# Patient Record
Sex: Male | Born: 1975 | Race: White | Hispanic: No | Marital: Married | Smoking: Never smoker
Health system: Southern US, Community
[De-identification: ages and names within clinical notes are randomized; demographics above are authoritative.]

---

## 2003-07-21 HISTORY — PX: EXTERNAL EAR SURGERY: SHX627

## 2006-08-03 ENCOUNTER — Emergency Department (HOSPITAL_COMMUNITY): Admission: EM | Admit: 2006-08-03 | Discharge: 2006-08-03 | Payer: Self-pay | Admitting: Emergency Medicine

## 2006-09-06 ENCOUNTER — Ambulatory Visit: Payer: Self-pay | Admitting: Family Medicine

## 2006-09-14 ENCOUNTER — Ambulatory Visit: Payer: Self-pay | Admitting: Family Medicine

## 2006-10-26 ENCOUNTER — Emergency Department (HOSPITAL_COMMUNITY): Admission: EM | Admit: 2006-10-26 | Discharge: 2006-10-26 | Payer: Self-pay | Admitting: Emergency Medicine

## 2007-11-17 ENCOUNTER — Emergency Department (HOSPITAL_COMMUNITY): Admission: EM | Admit: 2007-11-17 | Discharge: 2007-11-17 | Payer: Self-pay | Admitting: Emergency Medicine

## 2013-05-16 ENCOUNTER — Encounter (HOSPITAL_COMMUNITY): Payer: Self-pay | Admitting: Emergency Medicine

## 2013-05-16 ENCOUNTER — Emergency Department (HOSPITAL_COMMUNITY): Payer: Self-pay

## 2013-05-16 ENCOUNTER — Emergency Department (HOSPITAL_COMMUNITY)
Admission: EM | Admit: 2013-05-16 | Discharge: 2013-05-17 | Disposition: A | Discharge status: Home / Self Care | Payer: Self-pay | Attending: Emergency Medicine | Admitting: Emergency Medicine

## 2013-05-16 DIAGNOSIS — R109 Unspecified abdominal pain: Secondary | ICD-10-CM

## 2013-05-16 DIAGNOSIS — K2970 Gastritis, unspecified, without bleeding: Secondary | ICD-10-CM

## 2013-05-16 LAB — COMPREHENSIVE METABOLIC PANEL
ALT: 16 U/L (ref 0–53)
CO2: 25 mEq/L (ref 19–32)
Creatinine, Ser: 0.98 mg/dL (ref 0.50–1.35)
GFR calc Af Amer: >90 mL/min (ref >90)
GFR calc non Af Amer: >90 mL/min (ref >90)
Sodium: 136 mEq/L (ref 135–145)

## 2013-05-16 LAB — LIPASE, BLOOD: Lipase: 20 U/L (ref 11–59)

## 2013-05-16 MED ORDER — ONDANSETRON HCL 4 MG/2ML IJ SOLN
4.0000 mg | Freq: Once | INTRAMUSCULAR | Status: AC
  Administered 2013-05-16: 4 mg via INTRAVENOUS
  Filled 2013-05-16: qty 2

## 2013-05-16 NOTE — ED Provider Notes (Signed)
CSN: [id number]     Arrival date & time 05/16/13  2212 History   First MD Initiated Contact with Patient 05/16/13 2258     Chief Complaint  Patient presents with  . Abdominal Pain   (Consider location/radiation/quality/duration/timing/severity/associated sxs/prior Treatment) HPI Comments: Patient presents to the ER for evaluation of abdominal pain. Pain is present central upper abdomen for one week. He reports that the pain has been coming and going. He has taken Tylenol and the small without relief. He did experience his first nausea and vomiting tonight. Has not had any fever or diarrhea. Patient reports that he had similar symptoms years ago that was attributed to drinking alcohol. He has been sober for several years, in the last 2 months, however, has begun drinking to 6 beers a night after work. He has not had any hematemesis. No melena. Patient reports that since she has come back to the room in the emergency department, his pain has resolved.  Patient is a 37 y.o. male presenting with abdominal pain.  Abdominal Pain Associated symptoms: nausea and vomiting   Associated symptoms: no diarrhea and no fever     History reviewed. No pertinent past medical history. Past Surgical History  Procedure Laterality Date  . External ear surgery Left 2005   History reviewed. No pertinent family history. History  Substance Use Topics  . Smoking status: Never Smoker   . Smokeless tobacco: Never Used  . Alcohol Use: 1.8 oz/week    3 Cans of beer per week     Comment: occ.    Review of Systems  Constitutional: Negative for fever.  Gastrointestinal: Positive for nausea, vomiting and abdominal pain. Negative for diarrhea and blood in stool.  All other systems reviewed and are negative.    Allergies  Review of patient's allergies indicates no known allergies.  Home Medications   Current Outpatient Rx  Name  Route  Sig  Dispense  Refill  . acetaminophen (TYLENOL) 500 MG tablet   Oral   Take 1,000 mg by mouth every 6 (six) hours as needed for pain.         Marland Kitchen bismuth subsalicylate (PEPTO BISMOL) 262 MG/15ML suspension   Oral   Take 15 mLs by mouth every 6 (six) hours as needed for indigestion.          BP 129/71  Pulse 69  Temp(Src) 98.9 F (37.2 C) (Oral)  Resp 18  Ht 5\' 6"  (1.676 m)  Wt 170 lb (77.111 kg)  BMI 27.45 kg/m2  SpO2 100% Physical Exam  Constitutional: He is oriented to person, place, and time. He appears well-developed and well-nourished. No distress.  HENT:  Head: Normocephalic and atraumatic.  Right Ear: Hearing normal.  Left Ear: Hearing normal.  Nose: Nose normal.  Mouth/Throat: Oropharynx is clear and moist and mucous membranes are normal.  Eyes: Conjunctivae and EOM are normal. Pupils are equal, round, and reactive to light.  Neck: Normal range of motion. Neck supple.  Cardiovascular: Regular rhythm, S1 normal and S2 normal.  Exam reveals no gallop and no friction rub.   No murmur heard. Pulmonary/Chest: Effort normal and breath sounds normal. No respiratory distress. He exhibits no tenderness.  Abdominal: Soft. Normal appearance and bowel sounds are normal. There is no hepatosplenomegaly. There is no tenderness. There is no rebound, no guarding, no tenderness at McBurney's point and negative Murphy's sign. No hernia.  Musculoskeletal: Normal range of motion.  Neurological: He is alert and oriented to person, place, and time. He has  normal strength. No cranial nerve deficit or sensory deficit. Coordination normal. GCS eye subscore is 4. GCS verbal subscore is 5. GCS motor subscore is 6.  Skin: Skin is warm, dry and intact. No rash noted. No cyanosis.  Psychiatric: He has a normal mood and affect. His speech is normal and behavior is normal. Thought content normal.    ED Course  Procedures (including critical care time) Labs Review Labs Reviewed  CBC WITH DIFFERENTIAL - Abnormal; Notable for the following:    Eosinophils Relative 8 (*)     All other components within normal limits  COMPREHENSIVE METABOLIC PANEL - Abnormal; Notable for the following:    Glucose, Bld 123 (*)    All other components within normal limits  LIPASE, BLOOD  URINALYSIS, ROUTINE W REFLEX MICROSCOPIC   Imaging Review Dg Abd Acute W/chest  05/16/2013   CLINICAL DATA:  Abdominal pain.  EXAM: ACUTE ABDOMEN SERIES (ABDOMEN 2 VIEW & CHEST 1 VIEW)  COMPARISON:  08/03/2006 chest x-ray.  FINDINGS: Abnormal bowel gas pattern with multiple air-fluid levels, both in small and large bowel. There appears to be fold thickening of small bowel loops in the central abdomen, which are up to 3 cm in diameter. No pneumoperitoneum. No abnormal intra-abdominal mass effect or calcification. Clear lungs. No effusion or pneumothorax. Normal heart size. No acute osseous findings.  IMPRESSION: Abnormal bowel gas pattern. Multiple fluid levels in colon and small bowel favors ileus, but an early small bowel obstruction would have a similar appearance.   Electronically Signed   By: Tiburcio Pea M.D.   On: 05/16/2013 23:38    EKG Interpretation   None       MDM  Diagnosis: Abdominal pain  Post presents to the ER for evaluation of abdominal pain for one week. Patient has been having intermittent pain in his upper abdomen. Upon evaluation here in the ER, however, his pain has resolved. Patient does report that he recently started drinking again and has had similar pain secondary to alcohol intake in the past. This is most likely consistent with gastritis. Patient's abdominal exam is benign and nontender. His vital signs are normal. Blood work is all normal including liver function tests and lipase. The concern for pancreatitis or gallbladder disease. Patient does not have lower abdominal pain or tenderness, no concern for appendicitis. X-ray does show an abnormal bowel gas pattern. Ileus favored, cannot rule out small bowel obstruction. Based on his current examination, however,  small bowel obstruction is not felt to be likely. Patient will be discharged from a clear liquid diet, advance as tolerated. Patient given instructions to return to the ER for increasing pain, nausea, vomiting or abdominal distention. Recommended that he stop drinking. We'll treat with Carafate and Zantac.    Gilda Crease, MD 05/17/13 [phone]

## 2013-05-16 NOTE — ED Notes (Signed)
Pt reports abdominal pain x 1 week, has been taking Tylenol and Pepto bismol at home without relief. Pt reports n/v tonight, denies diarrhea, LBM tonight.

## 2013-05-16 NOTE — ED Notes (Signed)
Patient ambulated to and from radiology dept without issue Patient in NAD

## 2013-05-16 NOTE — ED Notes (Signed)
Patient with c/o abdominal pain, N/V x 9 days No active N/V during assessment PIV placed, labs drawn and sent Patient medicated, see MAR Side rails up, call bell in reach

## 2013-05-17 LAB — URINALYSIS, ROUTINE W REFLEX MICROSCOPIC
Bilirubin Urine: NEGATIVE
Nitrite: NEGATIVE
Urobilinogen, UA: 0.2 mg/dL (ref 0.0–1.0)

## 2013-05-17 LAB — CBC WITH DIFFERENTIAL/PLATELET
Basophils Absolute: 0 10*3/uL (ref 0.0–0.1)
Basophils Relative: 0 % (ref 0–1)
Eosinophils Relative: 8 % — ABNORMAL HIGH (ref 0–5)
HCT: 48.1 % (ref 39.0–52.0)
Lymphocytes Relative: 14 % (ref 12–46)
Lymphs Abs: 1.1 10*3/uL (ref 0.7–4.0)
MCV: 87.3 fL (ref 78.0–100.0)
Monocytes Absolute: 0.3 10*3/uL (ref 0.1–1.0)
Monocytes Relative: 4 % (ref 3–12)
Neutro Abs: 5.8 10*3/uL (ref 1.7–7.7)
Neutrophils Relative %: 75 % (ref 43–77)

## 2013-05-17 MED ORDER — RANITIDINE HCL 150 MG PO TABS: 150.0000 mg | ORAL_TABLET | Freq: Two times a day (BID) | ORAL | Status: DC

## 2013-05-17 MED ORDER — SUCRALFATE 1 GM/10ML PO SUSP: 1.0000 g | Freq: Four times a day (QID) | ORAL | Status: DC

## 2013-05-17 NOTE — ED Notes (Signed)
Discharge instructions reviewed with patient and family Rx x 2 reviewed with patient and family Follow up instructions reviewed with patient and family Patient and family verbalized understanding to DC instructions, Rx and follow up care All questions answered  Patient and family deny further needs or concerns at this time Patient alert and oriented x 4 upon time of DC and in NAD

## 2015-05-14 ENCOUNTER — Emergency Department (HOSPITAL_COMMUNITY): Payer: Self-pay

## 2015-05-14 ENCOUNTER — Encounter (HOSPITAL_COMMUNITY): Payer: Self-pay | Admitting: Emergency Medicine

## 2015-05-14 ENCOUNTER — Emergency Department (HOSPITAL_COMMUNITY)
Admission: EM | Admit: 2015-05-14 | Discharge: 2015-05-14 | Disposition: A | Discharge status: Home / Self Care | Payer: Self-pay | Attending: Emergency Medicine | Admitting: Emergency Medicine

## 2015-05-14 DIAGNOSIS — S43005A Unspecified dislocation of left shoulder joint, initial encounter: Secondary | ICD-10-CM

## 2015-05-14 DIAGNOSIS — Y9389 Activity, other specified: Secondary | ICD-10-CM | POA: Insufficient documentation

## 2015-05-14 DIAGNOSIS — X58XXXA Exposure to other specified factors, initial encounter: Secondary | ICD-10-CM | POA: Insufficient documentation

## 2015-05-14 DIAGNOSIS — Y9289 Other specified places as the place of occurrence of the external cause: Secondary | ICD-10-CM | POA: Insufficient documentation

## 2015-05-14 DIAGNOSIS — S43015A Anterior dislocation of left humerus, initial encounter: Secondary | ICD-10-CM | POA: Insufficient documentation

## 2015-05-14 DIAGNOSIS — Y998 Other external cause status: Secondary | ICD-10-CM | POA: Insufficient documentation

## 2015-05-14 MED ORDER — MIDAZOLAM HCL 2 MG/2ML IJ SOLN
2.0000 mg | Freq: Once | INTRAMUSCULAR | Status: AC
  Administered 2015-05-14: 2 mg via INTRAVENOUS
  Filled 2015-05-14: qty 2

## 2015-05-14 MED ORDER — IBUPROFEN 800 MG PO TABS: 800.0000 mg | ORAL_TABLET | Freq: Three times a day (TID) | ORAL | Status: DC

## 2015-05-14 MED ORDER — ETOMIDATE 2 MG/ML IV SOLN
INTRAVENOUS | Status: AC | PRN
  Administered 2015-05-14: 12 mg via INTRAVENOUS

## 2015-05-14 MED ORDER — ETOMIDATE 2 MG/ML IV SOLN
12.0000 mg | Freq: Once | INTRAVENOUS | Status: DC
  Filled 2015-05-14: qty 10

## 2015-05-14 NOTE — ED Notes (Addendum)
Patient states he was trying to pick up a stove and he heard a popping noise. Patient states he has pain and limited movement to his left arm/shoulder. Patient's left shoulder appears lower than right shoulder. Patient's fingers have movement and sensation.

## 2015-05-14 NOTE — ED Provider Notes (Signed)
CSN: [id number]     Arrival date & time 05/14/15  1854 History   First MD Initiated Contact with Patient 05/14/15 1918     Chief Complaint  Patient presents with  . Arm Injury     (Consider location/radiation/quality/duration/timing/severity/associated sxs/prior Treatment) HPI Patient reports that he was trying to pick up a stove the evening before his evaluation and felt his left shoulder pop. Since then he has had a very difficult time moving it and it is very painful with just minor movements. He denies any prior history of a shoulder dislocation. He denies any other associated injury. He states he is otherwise healthy without any significant medical history. History reviewed. No pertinent past medical history. Past Surgical History  Procedure Laterality Date  . External ear surgery Left 2005   No family history on file. Social History  Substance Use Topics  . Smoking status: Never Smoker   . Smokeless tobacco: Never Used  . Alcohol Use: 1.8 oz/week    3 Cans of beer per week     Comment: occ.    Review of Systems 10 Systems reviewed and are negative for acute change except as noted in the HPI.   Allergies  Review of patient's allergies indicates no known allergies.  Home Medications   Prior to Admission medications   Medication Sig Start Date End Date Taking? Authorizing Provider  acetaminophen (TYLENOL) 500 MG tablet Take 1,000 mg by mouth every 6 (six) hours as needed for pain.   Yes Historical Provider, MD  ibuprofen (ADVIL,MOTRIN) 800 MG tablet Take 1 tablet (800 mg total) by mouth 3 (three) times daily. 05/14/15   Arby BarretteMarcy Raquel Racey, MD  ranitidine (ZANTAC) 150 MG tablet Take 1 tablet (150 mg total) by mouth 2 (two) times daily. Patient not taking: Reported on 05/14/2015 05/17/13   Gilda Creasehristopher J Pollina, MD  sucralfate (CARAFATE) 1 GM/10ML suspension Take 10 mLs (1 g total) by mouth 4 (four) times daily. Patient not taking: Reported on 05/14/2015 05/17/13    Gilda Creasehristopher J Pollina, MD   BP 141/83 mmHg  Pulse 87  Temp(Src) 98.1 F (36.7 C) (Oral)  Resp 18  SpO2 100% Physical Exam  Constitutional: He is oriented to person, place, and time. He appears well-developed and well-nourished.  Patient is holding his arm in an abducted position appears to be in pain with it. He is alert and appropriate. No respiratory distress.  HENT:  Head: Normocephalic and atraumatic.  Nose: Nose normal.  Mouth/Throat: Oropharynx is clear and moist.  Eyes: EOM are normal.  Neck: Neck supple.  Cardiovascular: Normal rate, regular rhythm, normal heart sounds and intact distal pulses.   Pulmonary/Chest: Effort normal and breath sounds normal.  Musculoskeletal:  Patient's left shoulder is asymmetric compared to the right with appearance of depression below the [facility] joint. Patient cannot move the arm at the shoulder. He has intact grip strength and normal neurovascular examination of the hand. He does have some deformity at the wrist on the left which she reports is old from her prior fracture. He has no pain associated with that. He also denies any pain at the elbow or the elbow joint however significant movement of these will make a lot of pain in the shoulder.  Neurological: He is alert and oriented to person, place, and time. He exhibits normal muscle tone. Coordination normal.  Skin: Skin is warm and dry.  Psychiatric: He has a normal mood and affect.    ED Course  .Sedation Date/Time: 05/15/2015 5:23 PM Performed  by: Abigayle Wilinski Authorized by: Arby Barrette  Consent:    Consent obtained:  Written   Consent given by:  Patient   Risks discussed:  Respiratory compromise necessitating ventilatory assistance and intubation, prolonged sedation necessitating reversal, inadequate sedation, allergic reaction and prolonged hypoxia resulting in organ damage   Alternatives discussed: Attempted reduction without sedation. Indications:    Sedation purpose:   Dislocation reduction   Procedure necessitating sedation performed by:  Physician performing sedation   Intended level of sedation:  Moderate (conscious sedation) Pre-sedation assessment:    NPO status caution: urgency dictates proceeding with non-ideal NPO status     ASA classification: class 1 - normal, healthy patient     Neck mobility: normal     Mouth opening:  3 or more finger widths   Mallampati score:  I - soft palate, uvula, fauces, pillars visible   Pre-sedation assessments completed and reviewed: airway patency, cardiovascular function, mental status, pain level and respiratory function   Immediate pre-procedure details:    Reviewed: vital signs     Verified: bag valve mask available, emergency equipment available, intubation equipment available, IV patency confirmed, oxygen available, reversal medications available and suction available   Procedure details (see MAR for exact dosages):    Preoxygenation:  Nasal cannula   Sedation:  Etomidate   Intra-procedure monitoring:  Blood pressure monitoring, cardiac monitor, continuous pulse oximetry, frequent vital sign checks and frequent LOC assessments   Intra-procedure events: none   Post-procedure details:    Attendance: Constant attendance by certified staff until patient recovered     Recovery: Patient returned to pre-procedure baseline     Post-sedation assessments completed and reviewed: airway patency, cardiovascular function, mental status, nausea/vomiting and respiratory function     Patient is stable for discharge or admission: Yes     Patient tolerance:  Tolerated well, no immediate complications  (including critical care time) Procedure: Shoulder dislocation reduction performed by myself. Patient was sedated with etomidate with good sedation and stable vital signs. With moderate traction and stabilization of the chest wall by an assistant, the shoulder reduced easily. He was then placed in a sling and swath. He was  reassessed for pain one sedation had worn off and patient reported significant improvement with pain and had normal neurovascular exam post procedure. Labs Review Labs Reviewed - No data to display  Imaging Review Dg Shoulder Left  05/14/2015  CLINICAL DATA:  Status post reduction of left shoulder dislocation. Initial encounter. EXAM: LEFT SHOULDER - 2+ VIEW COMPARISON:  Left shoulder radiographs performed earlier today at 8:01 p.m. FINDINGS: There has been successful reduction of the left humeral head dislocation. The previously noted Hill-Sachs lesion was relatively medial in nature, with minimal displaced cortical fragments noted on the current study. No osseous Bankart lesion is seen. The left acromioclavicular joint is unremarkable. The left lung appears grossly clear. No definite soft tissue abnormalities are characterized on radiograph. IMPRESSION: Successful reduction of left humeral head dislocation. Hill-Sachs lesion is noted as relatively medial on the prior study, with minimal displaced cortical fragments seen on the current study. No osseous Bankart lesion seen. Electronically Signed   By: Roanna Raider M.D.   On: 05/14/2015 21:48   Dg Shoulder Left  05/14/2015  CLINICAL DATA:  39 year old male with left shoulder pain after lifting instead of today. Shoulder deformity. EXAM: LEFT SHOULDER - 2+ VIEW COMPARISON:  No priors. FINDINGS: Two views of the left shoulder demonstrate an anterior subcoracoid shoulder dislocation. The appearance suggests impaction of the  humeral head on the underlying glenoid, with potential Hill-Sachs fracture. IMPRESSION: 1. Anterior subcoracoid shoulder dislocation with potential Hill-Sachs fracture of the humeral head. Electronically Signed   By: Trudie Reed M.D.   On: 05/14/2015 20:11   I have personally reviewed and evaluated these images and lab results as part of my medical decision-making.   EKG Interpretation None      MDM   Final diagnoses:   Shoulder dislocation, left, initial encounter   Patient had isolated shoulder dislocation. Patient initially was agreeable to an attempt at reduction without sedation. With traction, I was unable to reduce the shoulder without sedation. With sedation and the shoulder reduced for easily. He is given subsequent instructions on maintaining his sling, no elevation of the arm of the head and orthopedic follow-up. Patient is given ibuprofen for pain control.    Arby Barrette, MD 05/15/15 [phone]

## 2015-05-14 NOTE — Discharge Instructions (Signed)
Shoulder Dislocation °A shoulder dislocation happens when the upper arm bone (humerus) moves out of the shoulder joint. The shoulder joint is the part of the shoulder where the humerus, shoulder blade (scapula), and collarbone (clavicle) meet. °CAUSES °This condition is often caused by: °· A fall. °· A hit to the shoulder. °· A forceful movement of the shoulder. °RISK FACTORS °This condition is more likely to develop in people who play sports. °SYMPTOMS °Symptoms of this condition include: °· Deformity of the shoulder. °· Intense pain. °· Inability to move the shoulder. °· Numbness, weakness, or tingling in your neck or down your arm. °· Bruising or swelling around your shoulder. °DIAGNOSIS °This condition is diagnosed with a physical exam. After the exam, tests may be done to check for related problems. Tests that may be done include: °· X-ray. This may be done to check for broken bones. °· MRI. This may be done to check for damage to the tissues around the shoulder. °· Electromyogram. This may be done to check for nerve damage. °TREATMENT °This condition is treated with a procedure to place the humerus back in the joint. This procedure is called a reduction. There are two types of reduction: °· Closed reduction. In this procedure, the humerus is placed back in the joint without surgery. The health care provider uses his or her hands to guide the bone back into place. °· Open reduction. In this procedure, the humerus is placed back in the joint with surgery. An open reduction may be recommended if: °¨ You have a weak shoulder joint or weak ligaments. °¨ You have had more than one shoulder dislocation. °¨ The nerves or blood vessels around your shoulder have been damaged. °After the humerus is placed back into the joint, your arm will be placed in a splint or sling to prevent it from moving. You will need to wear the splint or sling until your shoulder heals. When the splint or sling is removed, you may have  physical therapy to help improve the range of motion in your shoulder joint. °HOME CARE INSTRUCTIONS °If You Have a Splint or Sling: °· Wear it as told by your health care provider. Remove it only as told by your health care provider. °· Loosen it if your fingers become numb and tingle, or if they turn cold and blue. °· Keep it clean and dry. °Bathing °· Do not take baths, swim, or use a hot tub until your health care provider approves. Ask your health care provider if you can take showers. You may only be allowed to take sponge baths for bathing. °· If your health care provider approves bathing and showering, cover your splint or sling with a watertight plastic bag to protect it from water. Do not let the splint or sling get wet. °Managing Pain, Stiffness, and Swelling °· If directed, apply ice to the injured area. °¨ Put ice in a plastic bag. °¨ Place a towel between your skin and the bag. °¨ Leave the ice on for 20 minutes, 2-3 times per day. °· Move your fingers often to avoid stiffness and to decrease swelling. °· Raise (elevate) the injured area above the level of your heart while you are sitting or lying down. °Driving °· Do not drive while wearing a splint or sling on a hand that you use for driving. °· Do not drive or operate heavy machinery while taking pain medicine. °Activity °· Return to your normal activities as told by your health care provider. Ask your   health care provider what activities are safe for you.  Perform range-of-motion exercises only as told by your health care provider.  Exercise your hand by squeezing a soft ball. This helps to decrease stiffness and swelling in your hand and wrist. General Instructions  Take over-the-counter and prescription medicines only as told by your health care provider.  Do not use any tobacco products, including cigarettes, chewing tobacco, or e-cigarettes. Tobacco can delay bone and tissue healing. If you need help quitting, ask your health care  provider.  Keep all follow-up visits as told by your health care provider. This is important. SEEK MEDICAL CARE IF:  Your splint or sling gets damaged. SEEK IMMEDIATE MEDICAL CARE IF:  Your pain gets worse rather than better.  You lose feeling in your arm or hand.  Your arm or hand becomes white and cold.   This information is not intended to replace advice given to you by your health care provider. Make sure you discuss any questions you have with your health care provider.   Document Released: 03/31/2001 Document Revised: 03/27/2015 Document Reviewed: 10/29/2014 Elsevier Interactive Patient Education 2016 [employer].   Emergency Department Resource Guide 1) Find a Doctor and Pay Out of Pocket Although you won't have to find out who is covered by your insurance plan, it is a good idea to ask around and get recommendations. You will then need to call the office and see if the doctor you have chosen will accept you as a new patient and what types of options they offer for patients who are self-pay. Some doctors offer discounts or will set up payment plans for their patients who do not have insurance, but you will need to ask so you aren't surprised when you get to your appointment.  2) Contact Your Local Health Department Not all health departments have doctors that can see patients for sick visits, but many do, so it is worth a call to see if yours does. If you don't know where your local health department is, you can check in your phone book. The CDC also has a tool to help you locate your state's health department, and many state websites also have listings of all of their local health departments.  3) Find a Walk-in Clinic If your illness is not likely to be very severe or complicated, you may want to try a walk in clinic. These are popping up all over the country in pharmacies, drugstores, and shopping centers. They're usually staffed by nurse practitioners or physician assistants  that have been trained to treat common illnesses and complaints. They're usually fairly quick and inexpensive. However, if you have serious medical issues or chronic medical problems, these are probably not your best option.  No Primary Care Doctor: - Call Health Connect at  [phone] - they can help you locate a primary care doctor that  accepts your insurance, provides certain services, etc. - Physician Referral Service- [phone]  Chronic Pain Problems: Organization         Address  Phone   Notes  [facility]  [phone] Patients need to be referred by their primary care doctor.   Medication Assistance: Organization         Address  Phone   Notes  [facility] Medication [facility] [address] [place], [place] [address] [phone] --Must be a resident of [facility] -- Must have NO insurance coverage whatsoever (no Medicaid/ Medicare, etc.) -- The pt. MUST  have a primary care doctor that directs their care regularly and follows them in the community   [facility]ist  [phone]   [employer]  [phone]    Agencies that provide inexpensive medical care: Organization         Address  Phone   Notes  [facility]  [phone]   [facility]    [phone]   [facility] [address] [place], [place] [address] [phone]   [facility] [address] [place] [place] [phone]   Planned Parenthood    [phone]   [facility]    [phone]   [facility]  [address], [place] Phone:  [phone], Fax:  [phone] Hours of Operation:  9 am - 6 pm, M-F.  Also accepts Medicaid/Medicare and self-pay.  [facility]  [address], [place] Phone: [phone], Fax: [phone]. Hours of Operation:  8:30 am - 5:30 pm, M-F.  Also accepts Medicaid  and self-pay.  [facility], [place] [address], [place] Phone: [phone]   [facility] [address] Natasha Bence [place], [place] [phone], Ext. [phone] Mondays & Thursdays: 7-9 AM.  First 15 patients are seen on a first come, first serve basis.    Medicaid-accepting [facility] Providers:  Organization         Address  Phone   Notes  [facility] [address], [place] [phone] Also accepts self-pay patients.  [facility] [address] Laurell Josephs [place], [place]  [phone]   [facility] [address], [place] [phone]   [facility] [address] [place] [phone]   Renaye Rakers [address], [place]   [phone] Only accepts [place] patients after they have their name applied to their card.   Self-Pay (no insurance) in [facility]:  Organization         Address  Phone   Notes  Sickle Cell Patients, [facility] [address] [place], [place] [phone]   [facility] [address] [place], [place] [phone]   [facility]  [address], [place] [phone]   [facility]/Dr. Osei-Bonsu  [address], [place] or [address], [place] [phone] Phone number for both [place] and [place] locations is the same.  [facility]gent [facility] [address] [place] [phone]   [facility] [address] [place] or [address] [phone]   [facility] [address], [place] [phone], phone; [phone], fax Sees patients 1st and 3rd Saturday of every month.  Must not qualify for public or private insurance (i.e. Medicaid, Medicare, [place] Health Choice, Veterans' Benefits)  Household income  should be no more than 200% of the poverty level The clinic cannot treat you if you are pregnant or think you are pregnant  Sexually transmitted diseases are not treated at the clinic.    Dental Care: Organization         Address  Phone  Notes  [facility] Department of [facility] [address] [place],  [place] [phone] Accepts children up to age 65 who are enrolled in Medicaid or [place] Health Choice; pregnant women with a Medicaid card; and children who have applied for Medicaid or [place] Health Choice, but were declined, whose parents can pay a reduced fee at time of service.  [facility] Department of [facility]  [address], [place] [phone] Accepts children up to age 76 who are enrolled in [place] or [place] Health Choice; pregnant women with a Medicaid card; and children who have applied for Medicaid or [place] Health Choice, but were declined, whose parents can pay a reduced fee at time of service.  [facility]  [address] [place], [place] [phone] Patients are seen by appointment only. Walk-ins are not accepted. [facility] will see patients 47 years of age and older. Monday - Tuesday (8am-5pm) Most Wednesdays (8:30-5pm) $30 per visit, cash only  [facility]  [address], [facility] [phone] Patients are seen by appointment only. Walk-ins are not accepted. [facility] will see patients 13 years of age and older. One Wednesday Evening (Monthly: Volunteer Based).  $30 per visit, cash only  [employer]  [phone] for adults; Children under age 53, call Graduate Pediatric Dentistry at [phone]. Children aged 64-14, please call [phone] to request a pediatric application.  Dental services are provided in all areas of dental care including fillings, crowns and bridges, complete and partial dentures, implants, gum treatment,  root canals, and extractions. Preventive care is also provided. Treatment is provided to both adults and children. Patients are selected via a lottery and there is often a waiting list.   [facility], [facility], [facility] [address], [place]  [phone] [email or web address]   [facility] [address] [place], [place] [phone], Ext. [phone] Second and Fourth Thursday of each month, opens at 6:30 AM; Clinic ends at 9 AM.  Patients are seen on a first-come first-served basis, and a limited number are seen during each clinic.   [facility]  [address] Ether Griffins [place], [place] [phone]   Eligibility Requirements You must have lived in [place], [place], or [place] counties for at least the last three months.   You cannot be eligible for state or federal sponsored [place], including [employer], [place], or [employer].   You generally cannot be eligible for healthcare insurance through your employer.    How to apply: Eligibility screenings are held every Tuesday and Wednesday afternoon from 1:00 pm until 4:00 pm. You do not need an appointment for the interview!  [facility] [address], [place], [place] [phone]   [facility] Health Department  [phone]   [facility] Health Department  [phone]   [facility] Health Department  [phone]    Behavioral Health Resources in the Community: Intensive Outpatient Programs Organization         Address  Phone  Notes  [facility] [address] [place], [place] [phone]   [facility] [address] [place], [place] [phone]   [facility] [address], [place], [place]  [phone]   [facility] [address],  [place], [place] [phone] or [phone]   Substance Abuse Resources Organization         Address  Phone  Notes  [facility]  [phone]   [facility]  [phone]   [facility]  [phone]   [facility]  [phone]   Residential & Outpatient Substance Abuse Program  [phone]   Psychological Services Organization         Address  Phone  Notes  [facility]  [phone]- [phone]   [facility]  [phone]- [phone]   [facility] [address], [place] [phone] or [phone]    Mobile Crisis Teams Organization         Address  Phone  Notes  [facility], Mobile Crisis Care Unit  [phone]   [facility]  [address] [place], [place] [phone]   Doristine LocksSharon DeEsch [address] [place] [phone]    Self-Help/Support Groups Organization         Address  Phone             Notes  [facility] [place] - variety of support groups  336- [phone] Call for more information  Narcotics Anonymous (NA), [facility] [address], [place]  2 meetings at this location   Statisticianesidential Treatment Programs Organization         Address  Phone  Notes  [facility] [address],    [place]  [phone]   [facility]  [address], [place] [phone], [place], [place] [phone]   [facility] [address], [place] [phone] Admissions: 8am-3pm M-F  [facility] [address]    [place], [place] [phone]   [facility] [address], [place], [place] [phone]   [facility] [address]  [place], [place] [phone]   [facility] - Intensive Outpatient [address], [place], [place] [phone]   [facility] ([facility] [address]  [place], [place] [phone] or [phone]   [facility] ([facility]) [address] [place], [place] [phone] Accepts Medicaid  [facility] [address]  [place] [phone] Substance Abuse/Addiction Treatment   [facility] Resources Organization         Address  Phone  Notes  [facility]  [phone]   Angie FavaJulie Brannon, PhD [address] [place], [place]   [phone]) [phone] or [phone]) [phone]   [facility]   [address] [place], [place] [phone]   [facility] [address], [place], [place] [phone] Insurance/Medicaid/sponsorship through [facility]  [facility] [address]                                    [place], [place] [phone] Therapy/tele-psych/case  [facility] [address].   [place], [place] [phone]) [phone]    Dr. Lolly MustacheArfeen  [phone](336) [phone]   [facility]  [employer] [facility] 1) [address], [place] 2) [address], [place] 3)  [address], [place] [phone](336) [phone](336) [phone]  [phone]   [facility] Child Abuse Hotline [phone]) [phone] or [phone]) [phone] (After Hours)

## 2016-02-12 ENCOUNTER — Encounter (HOSPITAL_COMMUNITY): Payer: Self-pay | Admitting: Emergency Medicine

## 2016-02-12 ENCOUNTER — Emergency Department (HOSPITAL_COMMUNITY): Payer: Self-pay

## 2016-02-12 ENCOUNTER — Emergency Department (HOSPITAL_COMMUNITY)
Admission: EM | Admit: 2016-02-12 | Discharge: 2016-02-12 | Disposition: A | Discharge status: Home / Self Care | Payer: Self-pay | Attending: Physician Assistant | Admitting: Physician Assistant

## 2016-02-12 DIAGNOSIS — R300 Dysuria: Secondary | ICD-10-CM | POA: Insufficient documentation

## 2016-02-12 DIAGNOSIS — M545 Low back pain, unspecified: Secondary | ICD-10-CM

## 2016-02-12 DIAGNOSIS — Z79899 Other long term (current) drug therapy: Secondary | ICD-10-CM | POA: Insufficient documentation

## 2016-02-12 LAB — URINALYSIS, ROUTINE W REFLEX MICROSCOPIC
Bilirubin Urine: NEGATIVE
GLUCOSE, UA: NEGATIVE mg/dL
KETONES UR: NEGATIVE mg/dL
LEUKOCYTES UA: NEGATIVE
Nitrite: NEGATIVE
PROTEIN: NEGATIVE mg/dL
Specific Gravity, Urine: 1.021 (ref 1.005–1.030)
pH: 5.5 (ref 5.0–8.0)

## 2016-02-12 LAB — COMPREHENSIVE METABOLIC PANEL
ALT: 29 U/L (ref 17–63)
AST: 59 U/L — AB (ref 15–41)
Albumin: 3.7 g/dL (ref 3.5–5.0)
Alkaline Phosphatase: 82 U/L (ref 38–126)
Anion gap: 6 (ref 5–15)
BILIRUBIN TOTAL: 0.7 mg/dL (ref 0.3–1.2)
BUN: 15 mg/dL (ref 6–20)
CO2: 24 mmol/L (ref 22–32)
CREATININE: 1.14 mg/dL (ref 0.61–1.24)
Calcium: 8.5 mg/dL — ABNORMAL LOW (ref 8.9–10.3)
Chloride: 109 mmol/L (ref 101–111)
Glucose, Bld: 90 mg/dL (ref 65–99)
Potassium: 3.7 mmol/L (ref 3.5–5.1)
Sodium: 139 mmol/L (ref 135–145)
TOTAL PROTEIN: 7.5 g/dL (ref 6.5–8.1)

## 2016-02-12 LAB — CBC WITH DIFFERENTIAL/PLATELET
BASOS ABS: 0 10*3/uL (ref 0.0–0.1)
Basophils Relative: 1 %
EOS ABS: 0.7 10*3/uL (ref 0.0–0.7)
Eosinophils Relative: 14 %
HEMATOCRIT: 42.3 % (ref 39.0–52.0)
HEMOGLOBIN: 14.3 g/dL (ref 13.0–17.0)
LYMPHS ABS: 1.8 10*3/uL (ref 0.7–4.0)
Lymphocytes Relative: 35 %
MCH: 30.3 pg (ref 26.0–34.0)
MCHC: 33.8 g/dL (ref 30.0–36.0)
MCV: 89.6 fL (ref 78.0–100.0)
Monocytes Absolute: 0.4 10*3/uL (ref 0.1–1.0)
Monocytes Relative: 8 %
NEUTROS PCT: 42 %
Neutro Abs: 2.2 10*3/uL (ref 1.7–7.7)
Platelets: 199 10*3/uL (ref 150–400)
RBC: 4.72 MIL/uL (ref 4.22–5.81)
RDW: 14 % (ref 11.5–15.5)
WBC: 5.1 10*3/uL (ref 4.0–10.5)

## 2016-02-12 LAB — I-STAT CHEM 8, ED
BUN: 15 mg/dL (ref 6–20)
CREATININE: 1.2 mg/dL (ref 0.61–1.24)
Calcium, Ion: 1.17 mmol/L (ref 1.13–1.30)
Chloride: 106 mmol/L (ref 101–111)
GLUCOSE: 88 mg/dL (ref 65–99)
HEMATOCRIT: 43 % (ref 39.0–52.0)
Hemoglobin: 14.6 g/dL (ref 13.0–17.0)
POTASSIUM: 3.8 mmol/L (ref 3.5–5.1)
Sodium: 141 mmol/L (ref 135–145)
TCO2: 24 mmol/L (ref 0–100)

## 2016-02-12 LAB — URINE MICROSCOPIC-ADD ON
BACTERIA UA: NONE SEEN
SQUAMOUS EPITHELIAL / LPF: NONE SEEN

## 2016-02-12 LAB — LIPASE, BLOOD: LIPASE: 29 U/L (ref 11–51)

## 2016-02-12 MED ORDER — NAPROXEN 500 MG PO TABS: 500.0000 mg | ORAL_TABLET | Freq: Two times a day (BID) | ORAL | 0 refills | Status: AC

## 2016-02-12 MED ORDER — METHOCARBAMOL 500 MG PO TABS: 500.0000 mg | ORAL_TABLET | Freq: Two times a day (BID) | ORAL | 0 refills | Status: AC

## 2016-02-12 MED ORDER — KETOROLAC TROMETHAMINE 60 MG/2ML IM SOLN
60.0000 mg | Freq: Once | INTRAMUSCULAR | Status: AC
  Administered 2016-02-12: 60 mg via INTRAMUSCULAR
  Filled 2016-02-12: qty 2

## 2016-02-12 NOTE — ED Provider Notes (Signed)
[facility]-EMERGENCY DEPT Provider Note   CSN: [id number] Arrival date & time: 02/12/16  1550  First Provider Contact:  None       History   Chief Complaint Chief Complaint  Patient presents with  . Flank Pain  . Urinary Frequency    HPI Robert Serrano is a 40 y.o. male.  Robert Serrano is a 40 y.o. male who presents to emergency department complaining of bilateral flank pain, dysuria for several weeks. States symptoms worsened today. He states that he is able to urinate denies any hematuria. He denies any nausea or vomiting. He states he is having some left upper quadrant pain. He is an alcohol drinker, daily. Denies any prior kidney issues or abdominal issues. No history of kidney stones. Pain is worsened with movement. Nothing makes it better. He has not tried taking any medications.       History reviewed. No pertinent past medical history.  There are no active problems to display for this patient.   Past Surgical History:  Procedure Laterality Date  . EXTERNAL EAR SURGERY Left 2005       Home Medications    Prior to Admission medications   Medication Sig Start Date End Date Taking? Authorizing Provider  acetaminophen (TYLENOL) 500 MG tablet Take 1,000 mg by mouth every 6 (six) hours as needed for mild pain, moderate pain or headache.    Yes Historical Provider, MD    Family History History reviewed. No pertinent family history.  Social History Social History  Substance Use Topics  . Smoking status: Never Smoker  . Smokeless tobacco: Never Used  . Alcohol use 1.8 oz/week    3 Cans of beer per week     Comment: occ.     Allergies   Review of patient's allergies indicates no known allergies.   Review of Systems Review of Systems  Constitutional: Negative for chills and fever.  Respiratory: Negative for cough, chest tightness and shortness of breath.   Cardiovascular: Negative for chest pain, palpitations and leg swelling.  Gastrointestinal: Positive for  abdominal pain. Negative for abdominal distention, diarrhea, nausea and vomiting.  Genitourinary: Positive for difficulty urinating and flank pain. Negative for dysuria, frequency, hematuria and urgency.  Musculoskeletal: Negative for arthralgias, myalgias, neck pain and neck stiffness.  Skin: Negative for rash.  Allergic/Immunologic: Negative for immunocompromised state.  Neurological: Negative for dizziness, weakness, light-headedness, numbness and headaches.  All other systems reviewed and are negative.    Physical Exam Updated Vital Signs BP 113/74 (BP Location: Left Arm)   Pulse (!) 57   Temp 98.4 F (36.9 C) (Oral)   Resp 16   Ht  (1.676 m)   Wt 81.6 kg   SpO2 99%   BMI 29.05 kg/m   Physical Exam  Constitutional: He appears well-developed and well-nourished. No distress.  HENT:  Head: Normocephalic and atraumatic.  Eyes: Conjunctivae are normal.  Neck: Neck supple.  Cardiovascular: Normal rate, regular rhythm and normal heart sounds.   Pulmonary/Chest: Effort normal. No respiratory distress. He has no wheezes. He has no rales.  Abdominal: Soft. Bowel sounds are normal. He exhibits no distension. There is tenderness. There is no rebound.  Bilateral CVA tenderness. Left upper quadrant tenderness  Musculoskeletal: He exhibits no edema.  Tenderness to palpation over bilateral paraspinal muscles. Pain with forward flexion of the spine.   Neurological: He is alert.  5/5 and equal lower extremity strength. 2+ and equal patellar reflexes bilaterally. Pt able to dorsiflex bilateral toes and feet with  good strength against resistance. Equal sensation bilaterally over thighs and lower legs.   Skin: Skin is warm and dry. Capillary refill takes less than 2 seconds.  Psychiatric: He has a normal mood and affect.  Nursing note and vitals reviewed.    ED Treatments / Results  Labs (all labs ordered are listed, but only abnormal results are displayed) Labs Reviewed    URINALYSIS, ROUTINE W REFLEX MICROSCOPIC (NOT AT [facility]) - Abnormal; Notable for the following:       Result Value   Hgb urine dipstick TRACE (*)    All other components within normal limits  URINE MICROSCOPIC-ADD ON  CBC WITH DIFFERENTIAL/PLATELET  COMPREHENSIVE METABOLIC PANEL  LIPASE, BLOOD  I-STAT CHEM 8, ED    EKG  EKG Interpretation None       Radiology Ct Renal Stone Study  Result Date: 02/12/2016 CLINICAL DATA:  Bilateral flank pain with urinary frequency, pain with urination x2 months EXAM: CT ABDOMEN AND PELVIS WITHOUT CONTRAST TECHNIQUE: Multidetector CT imaging of the abdomen and pelvis was performed following the standard protocol without IV contrast. COMPARISON:  None. FINDINGS: Lower chest:  Lung bases are clear. Hepatobiliary: Unenhanced liver is unremarkable. Gallbladder is unremarkable. No intrahepatic or extrahepatic ductal dilatation. Pancreas: Within normal limits. Spleen: Within normal limits. Adrenals/Urinary Tract: Adrenal glands are within normal limits. Kidneys are within normal limits. No renal, ureteral, or bladder calculi.  No hydronephrosis. Bladder is mildly thick-walled although underdistended. Stomach/Bowel: Stomach is within normal limits. No evidence of bowel obstruction. Normal appendix (series 2/ image 62). Vascular/Lymphatic: No evidence of abdominal aortic aneurysm. Small upper abdominal/ retroperitoneal lymph nodes, including a 10 mm aortocaval node (series 2/image 31), at the upper limits of normal. Reproductive: Prostate is unremarkable. Other: No abdominopelvic ascites. Musculoskeletal: Visualized osseous structures are within normal limits. IMPRESSION: No renal, ureteral, or bladder calculi.  No hydronephrosis. Bladder is mildly thick-walled although underdistended. No CT findings to account for the patient's abdominal pain. Electronically Signed   By: Charline Bills M.D.   On: 02/12/2016 20:35   Procedures Procedures (including critical care  time)  Medications Ordered in ED Medications  ketorolac (TORADOL) injection 60 mg (60 mg Intramuscular Given 02/12/16 2046)   Patient emergency department with left upper quadrant tenderness, bilateral CVA tenderness, some urinary symptoms. No history of similar symptoms in the past. He is in no acute distress. His pain is worsened with movement. Question kidney stone versus pancreatitis versus musculoskeletal pain. Labs, urinalysis, CT ordered.  Patient feels better after Toradol. Labs pending  Labs showed no significant findings. Suspect musculoskeletal pain. Neurologically intact. We'll treat with NSAIDs and muscle relaxants. Follow up as needed.  Initial Impression / Assessment and Plan / ED Course  I have reviewed the triage vital signs and the nursing notes.  Pertinent labs & imaging results that were available during my care of the patient were reviewed by me and considered in my medical decision making (see chart for details).  Clinical Course      Final Clinical Impressions(s) / ED Diagnoses   Final diagnoses:  Bilateral low back pain without sciatica    New Prescriptions Discharge Medication List as of 02/12/2016  9:36 PM    START taking these medications   Details  methocarbamol (ROBAXIN) 500 MG tablet Take 1 tablet (500 mg total) by mouth 2 (two) times daily., Starting Wed 02/12/2016, Print    naproxen (NAPROSYN) 500 MG tablet Take 1 tablet (500 mg total) by mouth 2 (two) times daily., Starting Wed 02/12/2016,  Print         Jaynie Crumble, PA-C 02/13/16 2159    Courteney Randall An, MD 02/13/16 2349

## 2016-02-12 NOTE — Progress Notes (Signed)
EDCM spoke to patient at bedside. Patient confirms he does not have a pcp or insurance living in [place].  [facility] provide patient with contact information to [facility], informed patient of services there and walk in times.  EDCM also provided patient with list of pcps who accept self pay patients, list of discount pharmacies and websites needymeds.org and GoodRX.com for medication assistance, phone number to inquire about the orange card, phone number to inquire about Medicaid, phone number to inquire about the Affordable Care Act, financial resources in the community such as local churches, [employer], urban ministries, and dental assistance for uninsured patients.  Patient thankful for resources.  No further EDCM needs at this time.

## 2016-02-12 NOTE — ED Triage Notes (Signed)
Pt c/o bilateral flank pain with urinary frequency and pain with urination x 2 months. Denies N/V/D.

## 2016-02-12 NOTE — Discharge Instructions (Signed)
Your blood work and CT are normal. Take naprosyn for pain. Robaxin for spasms. No heavy lifting for at least a week. Try heating pads. Follow up with a family doctor.

## 2016-02-14 LAB — URINE CULTURE: CULTURE: NO GROWTH

## 2017-03-23 IMAGING — CT CT RENAL STONE PROTOCOL
2 of 3 series · 16 of 46 positions shown, 18 images · non-contrast
Comparison: None.

CLINICAL DATA: Bilateral flank pain with urinary frequency, pain
with urination x2 months

EXAM:
CT ABDOMEN AND PELVIS WITHOUT CONTRAST
TECHNIQUE: Multidetector CT imaging of the abdomen and pelvis was performed
following the standard protocol without IV contrast.

[Series 3: lung · axial · 0.71mm/px · z∈[+1260,+1374]mm · 13 of 67 slices shown, 15 images]
[im 5/67  soft-tissue]
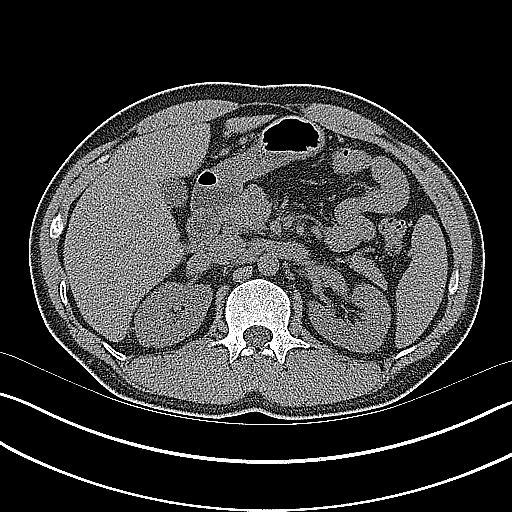
[im 5/67  bone]
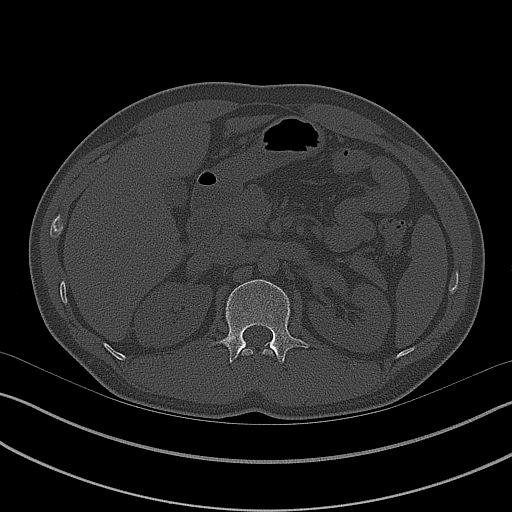
[im 9/67  soft-tissue]
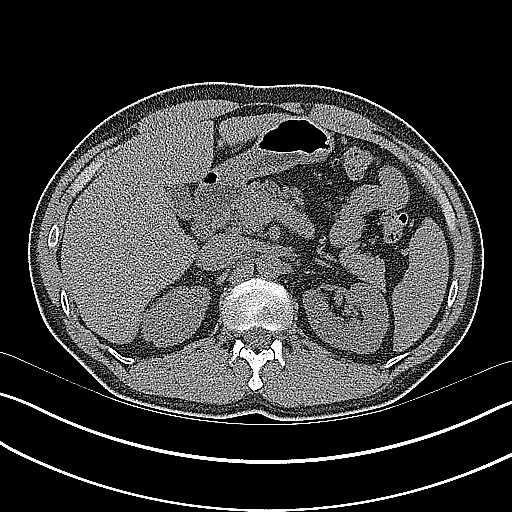
[im 13/67  soft-tissue]
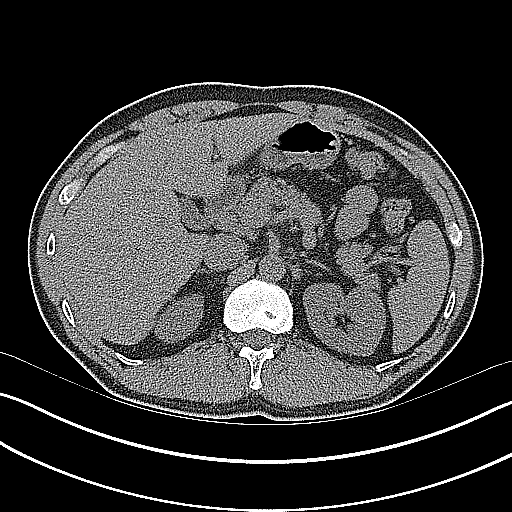
[im 20/67  soft-tissue]
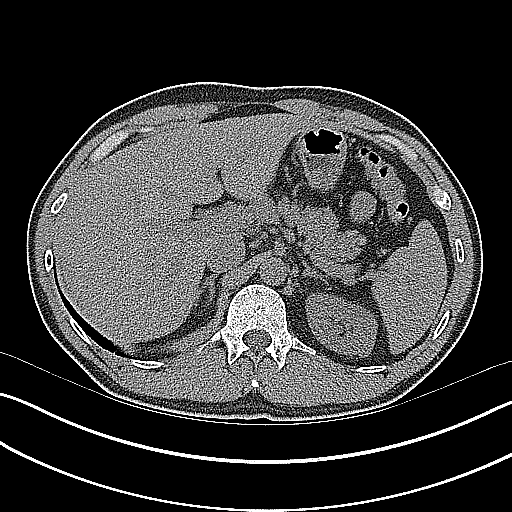
[im 24/67  soft-tissue]
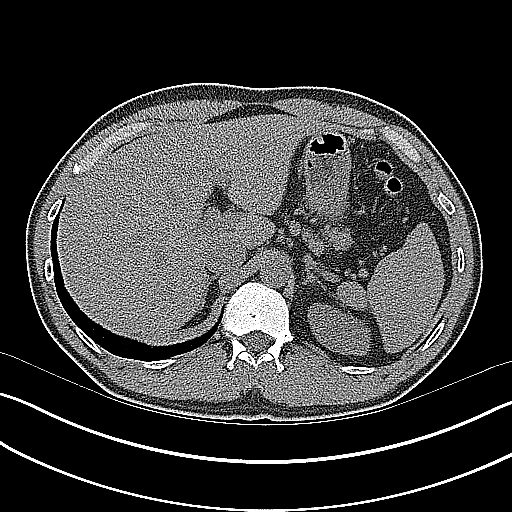
[im 28/67  soft-tissue]
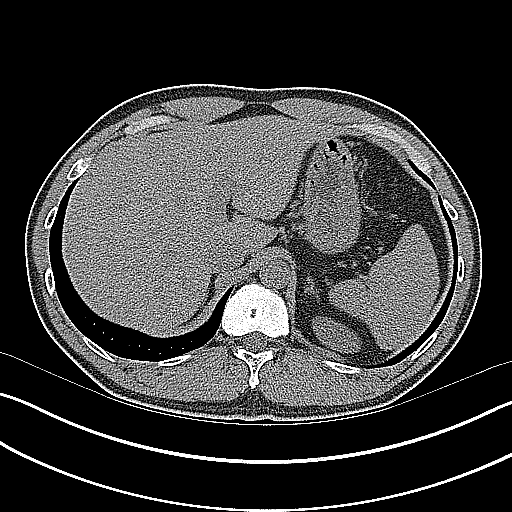
[im 35/67  soft-tissue]
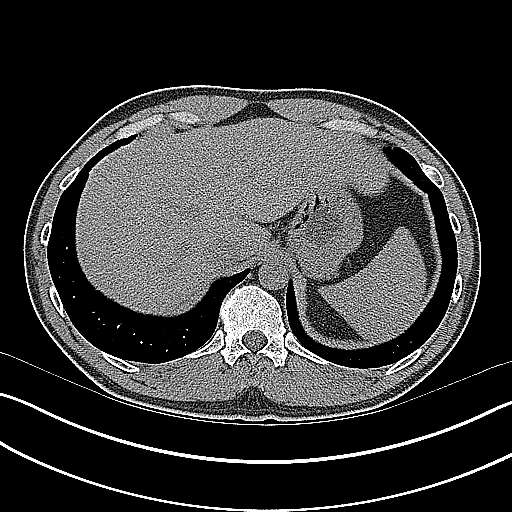
[im 39/67  soft-tissue]
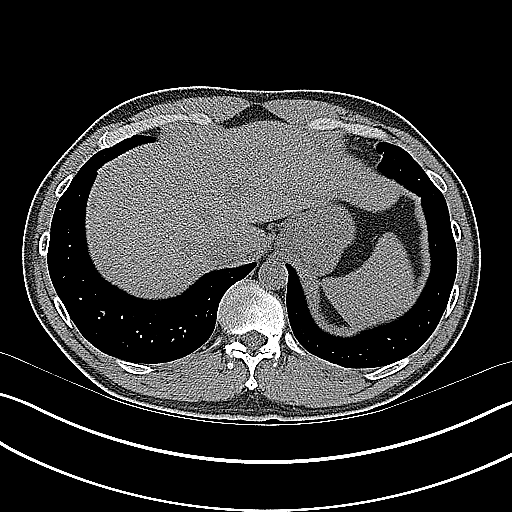
[im 43/67  soft-tissue]
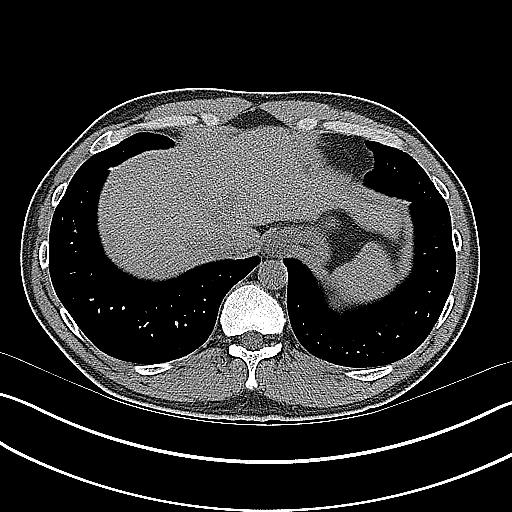
[im 43/67  bone]
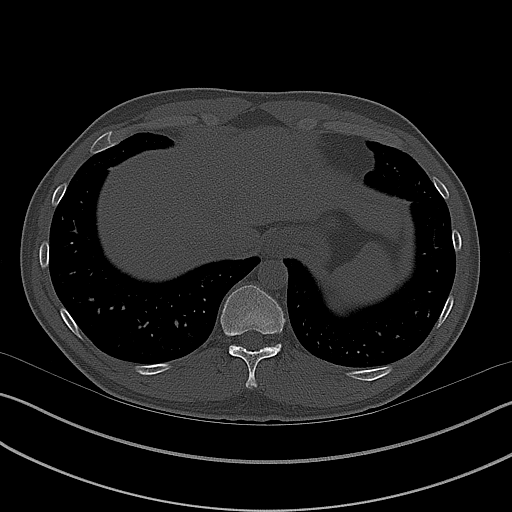
[im 47/67  soft-tissue]
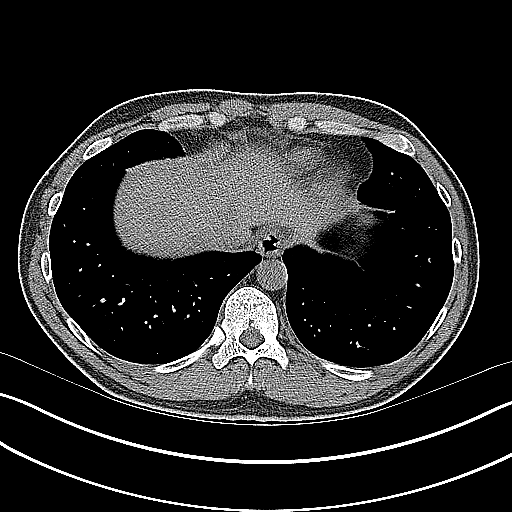
[im 54/67  soft-tissue]
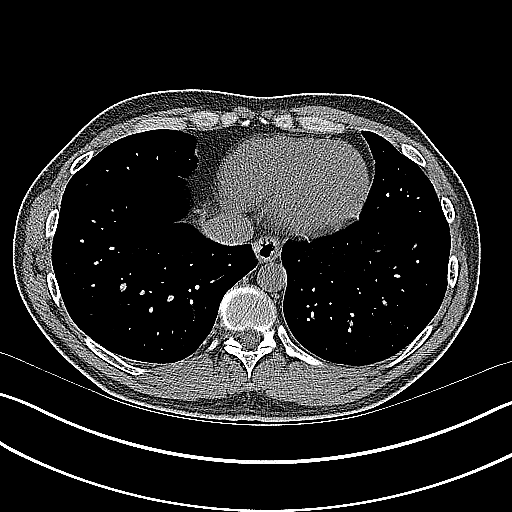
[im 58/67  soft-tissue]
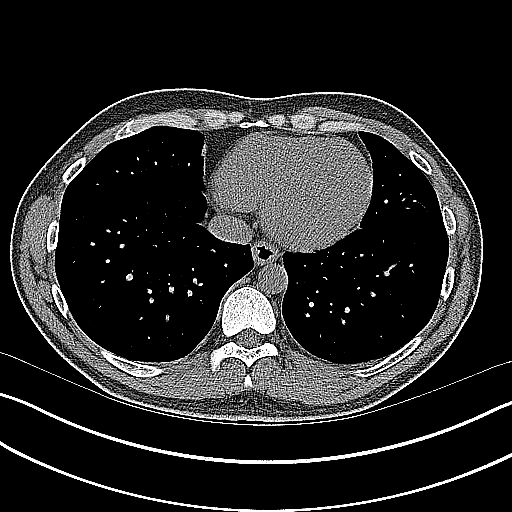
[im 62/67  soft-tissue]
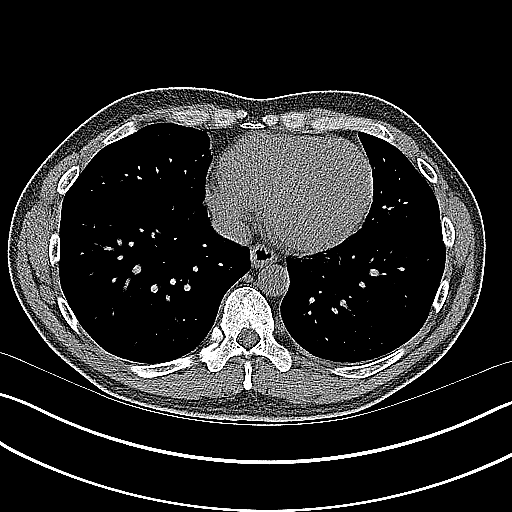

[Series 4: coronal · coronal · 0.66mm/px · 3 of 138 slices shown]
[im 46/138  soft-tissue]
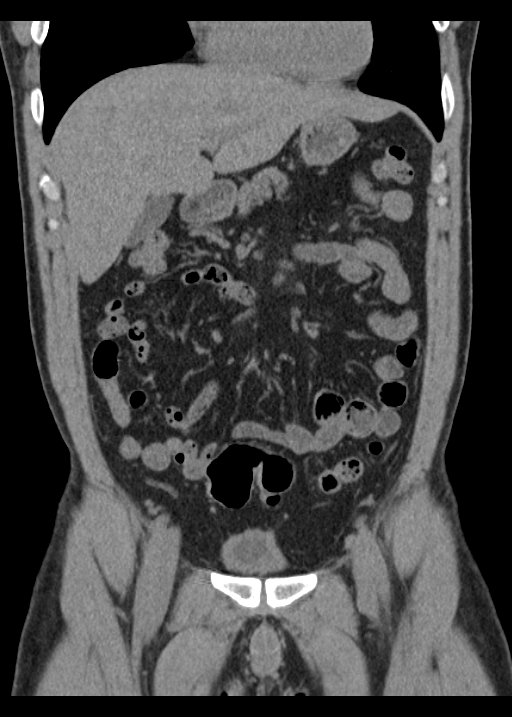
[im 61/138  soft-tissue]
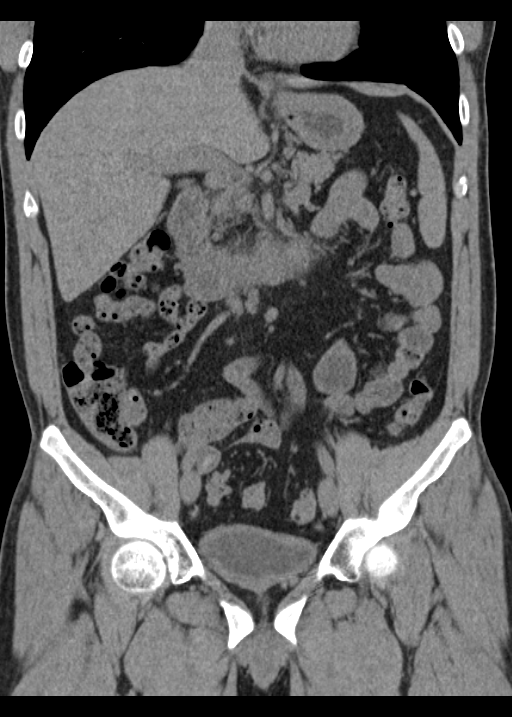
[im 77/138  soft-tissue]
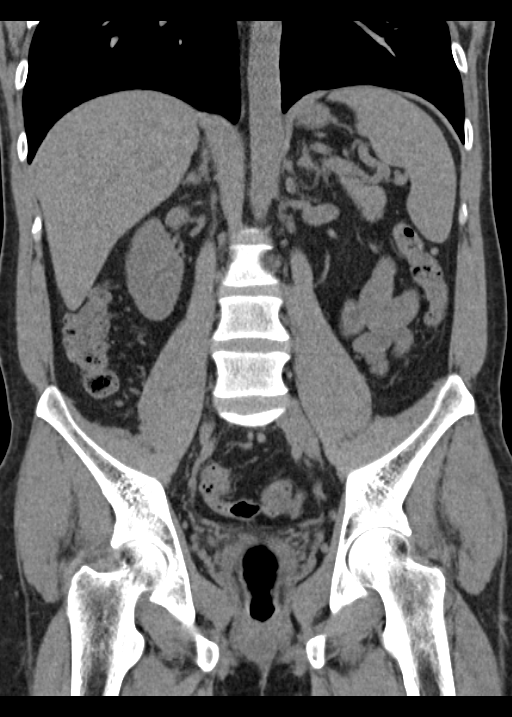

[16 of 46 positions shown; findings below may reference images not displayed]

FINDINGS: Lower chest:  Lung bases are clear.

Hepatobiliary: Unenhanced liver is unremarkable.

Gallbladder is unremarkable. No intrahepatic or extrahepatic ductal
dilatation.

Pancreas: Within normal limits.

Spleen: Within normal limits.

Adrenals/Urinary Tract: Adrenal glands are within normal limits.

Kidneys are within normal limits.

No renal, ureteral, or bladder calculi.  No hydronephrosis.

Bladder is mildly thick-walled although underdistended.

Stomach/Bowel: Stomach is within normal limits.

No evidence of bowel obstruction.

Normal appendix (series 2/ image 62).

Vascular/Lymphatic: No evidence of abdominal aortic aneurysm.

Small upper abdominal/ retroperitoneal lymph nodes, including a 10
mm aortocaval node (series 2/image 31), at the upper limits of
normal.

Reproductive: Prostate is unremarkable.

Other: No abdominopelvic ascites.

Musculoskeletal: Visualized osseous structures are within normal
limits.
IMPRESSION: No renal, ureteral, or bladder calculi.  No hydronephrosis.

Bladder is mildly thick-walled although underdistended.

No CT findings to account for the patient's abdominal pain.

## 2021-03-06 ENCOUNTER — Emergency Department (HOSPITAL_COMMUNITY)
Admission: EM | Admit: 2021-03-06 | Discharge: 2021-03-06 | Disposition: A | Discharge status: Home / Self Care | Payer: Self-pay | Attending: Emergency Medicine | Admitting: Emergency Medicine

## 2021-03-06 ENCOUNTER — Other Ambulatory Visit: Payer: Self-pay

## 2021-03-06 ENCOUNTER — Encounter (HOSPITAL_COMMUNITY): Payer: Self-pay

## 2021-03-06 DIAGNOSIS — H0289 Other specified disorders of eyelid: Secondary | ICD-10-CM

## 2021-03-06 DIAGNOSIS — H02849 Edema of unspecified eye, unspecified eyelid: Secondary | ICD-10-CM

## 2021-03-06 DIAGNOSIS — H5789 Other specified disorders of eye and adnexa: Secondary | ICD-10-CM | POA: Insufficient documentation

## 2021-03-06 MED ORDER — PREDNISONE 20 MG PO TABS
40.0000 mg | ORAL_TABLET | Freq: Once | ORAL | Status: AC
  Administered 2021-03-06: 40 mg via ORAL
  Filled 2021-03-06: qty 2

## 2021-03-06 MED ORDER — ERYTHROMYCIN 5 MG/GM OP OINT: TOPICAL_OINTMENT | OPHTHALMIC | 0 refills | Status: AC

## 2021-03-06 MED ORDER — PREDNISONE 20 MG PO TABS: 40.0000 mg | ORAL_TABLET | Freq: Every day | ORAL | 0 refills | Status: AC

## 2021-03-06 MED ORDER — DIPHENHYDRAMINE HCL 25 MG PO TABS: 25.0000 mg | ORAL_TABLET | Freq: Four times a day (QID) | ORAL | 0 refills | Status: AC | PRN

## 2021-03-06 MED ORDER — DIPHENHYDRAMINE HCL 25 MG PO CAPS
25.0000 mg | ORAL_CAPSULE | Freq: Once | ORAL | Status: AC
Start: 2021-03-06 — End: 2021-03-06
  Administered 2021-03-06: 25 mg via ORAL
  Filled 2021-03-06: qty 1

## 2021-03-06 MED ORDER — FAMOTIDINE 20 MG PO TABS: 20.0000 mg | ORAL_TABLET | Freq: Every day | ORAL | 0 refills | Status: AC

## 2021-03-06 MED ORDER — FAMOTIDINE 20 MG PO TABS
20.0000 mg | ORAL_TABLET | Freq: Once | ORAL | Status: AC
  Administered 2021-03-06: 20 mg via ORAL
  Filled 2021-03-06: qty 1

## 2021-03-06 NOTE — Discharge Instructions (Addendum)
It was a pleasure taking care of you today.  As discussed, your symptoms are either related to an allergic reaction or a mild infection of the eyelids.  I am sending you home with medication for an allergic reaction and antibiotic ointment.  Use as prescribed for the next 5 days.  Continue to use warm compresses for 15 minutes 4 times a day.  You may scrub your eyelids with shampoo twice daily.  I have included the number of the ophthalmologist.  Please call to schedule an appointment if symptoms not improve over the next few days.  Return to the ER for new or worsening symptoms.

## 2021-03-06 NOTE — ED Triage Notes (Addendum)
Patient  c/o right eye pain and swelling x 1 week. Patient states the same for left eye since this AM. Patient also reports the light causes pain.

## 2021-03-06 NOTE — ED Provider Notes (Signed)
[facility]-EMERGENCY DEPT Provider Note   CSN: [id number] Arrival date & time: 03/06/21  1248     History Chief Complaint  Patient presents with   Eye Pain   eye swelling    Robert Serrano is a 45 y.o. male with no significant past medical history who presents to the ED due to eyelid edema x1 week.  Patient states edema started in his right eye and then spread to his left eye yesterday.  Denies any erythema to eye. Denies fever and chills. Patient states his vision is "blurry"which is atypical for him.  Denies difficulties breathing.  No new products or medications.  No previous allergic reactions. No known allergies. Denies angioedema.  No difficulty swallowing.  He has not tried anything for his symptoms.  No aggravating or alleviating factors.   History obtained from patient and past medical records. No interpreter used during encounter.       History reviewed. No pertinent past medical history.  There are no problems to display for this patient.   Past Surgical History:  Procedure Laterality Date   EXTERNAL EAR SURGERY Left 2005       History reviewed. No pertinent family history.  Social History   Tobacco Use   Smoking status: Never   Smokeless tobacco: Never  Vaping Use   Vaping Use: Never used  Substance Use Topics   Alcohol use: Yes    Alcohol/week: 3.0 standard drinks    Types: 3 Cans of beer per week    Comment: occ.   Drug use: No    Home Medications Prior to Admission medications   Medication Sig Start Date End Date Taking? Authorizing Provider  diphenhydrAMINE (BENADRYL) 25 MG tablet Take 1 tablet (25 mg total) by mouth every 6 (six) hours as needed. 03/06/21  Yes Mannie Stabile, PA-C  erythromycin ophthalmic ointment Place a 1/2 inch ribbon of ointment to upper eyelids bilaterally 03/06/21  Yes Zamora Colton C, PA-C  famotidine (PEPCID) 20 MG tablet Take 1 tablet (20 mg total) by mouth daily for 5 days. 03/06/21 03/11/21  Yes Werner Labella, Merla Riches, PA-C  predniSONE (DELTASONE) 20 MG tablet Take 2 tablets (40 mg total) by mouth daily for 5 days. 03/06/21 03/11/21 Yes Chala Gul, Merla Riches, PA-C  acetaminophen (TYLENOL) 500 MG tablet Take 1,000 mg by mouth every 6 (six) hours as needed for mild pain, moderate pain or headache.     [provider]  methocarbamol (ROBAXIN) 500 MG tablet Take 1 tablet (500 mg total) by mouth 2 (two) times daily. 02/12/16   Kirichenko, Tatyana, PA-C  naproxen (NAPROSYN) 500 MG tablet Take 1 tablet (500 mg total) by mouth 2 (two) times daily. 02/12/16   Jaynie Crumble, PA-C    Allergies    Patient has no known allergies.  Review of Systems   Review of Systems  Constitutional:  Negative for chills and fever.  HENT:  Positive for facial swelling.   Eyes:  Positive for pain and visual disturbance. Negative for photophobia and redness.  All other systems reviewed and are negative.  Physical Exam Updated Vital Signs BP (!) 150/88 (BP Location: Left Arm)   Pulse 88   Temp 97.7 F (36.5 C)   Resp 16   Ht 5\' 6"  (1.676 m)   Wt 83.9 kg   SpO2 100%   BMI 29.86 kg/m   Physical Exam Vitals and nursing note reviewed.  Constitutional:      General: He is not in acute distress.  Appearance: He is not ill-appearing.  HENT:     Head: Normocephalic.  Eyes:     Extraocular Movements: Extraocular movements intact.     Pupils: Pupils are equal, round, and reactive to light.     Comments: Bilateral edematous eyelids.  No surrounding erythema.  Conjunctiva normal without injection.  No drainage.  EOMs intact.  Visual acuity 20/40 bilaterally.  No hyphema.  No ciliary flush.  Cardiovascular:     Rate and Rhythm: Normal rate and regular rhythm.     Pulses: Normal pulses.     Heart sounds: Normal heart sounds. No murmur heard.   No friction rub. No gallop.  Pulmonary:     Effort: Pulmonary effort is normal.     Breath sounds: Normal breath sounds.     Comments: Lungs clear to  auscultation bilaterally without stridor or wheeze. Abdominal:     General: Abdomen is flat. There is no distension.     Palpations: Abdomen is soft.     Tenderness: no abdominal tenderness There is no guarding or rebound.  Musculoskeletal:        General: Normal range of motion.     Cervical back: Neck supple.  Skin:    General: Skin is warm and dry.  Neurological:     General: No focal deficit present.     Mental Status: He is alert.  Psychiatric:        Mood and Affect: Mood normal.        Behavior: Behavior normal.    ED Results / Procedures / Treatments   Labs (all labs ordered are listed, but only abnormal results are displayed) Labs Reviewed - No data to display  EKG None  Radiology No results found.  Procedures Procedures   Medications Ordered in ED Medications  predniSONE (DELTASONE) tablet 40 mg (40 mg Oral Given 03/06/21 1345)  famotidine (PEPCID) tablet 20 mg (20 mg Oral Given 03/06/21 1345)  diphenhydrAMINE (BENADRYL) capsule 25 mg (25 mg Oral Given 03/06/21 1345)    ED Course  I have reviewed the triage vital signs and the nursing notes.  Pertinent labs & imaging results that were available during my care of the patient were reviewed by me and considered in my medical decision making (see chart for details).    MDM Rules/Calculators/A&P                          45 year old male presents to the ED due to bilateral eyelid edema x1 week.  No fever or chills.  No surrounding erythema.  Upon arrival, stable vitals.  Patient in no acute distress.  Patient nontoxic-appearing.  Physical exam significant for bilateral edematous eyelids.  No surrounding erythema. Conjunctiva without injection.  No ciliary flush.  No hyphema.  Visual acuity 20/40 bilaterally.  Suspect symptoms related to allergic reaction vs. Blepharitis.  Low suspicion for preseptal or orbital cellulitis.  Patient treated with prednisone, Pepcid, Benadryl here in the ED.  Patient discharged with same.   Also discharge patient with erythromycin ointment for possible blepharitis.  Ophthalmology number given to patient at discharge.  Advised patient to call if symptoms not improve over the next few days. Strict ED precautions discussed with patient. Patient states understanding and agrees to plan. Patient discharged home in no acute distress and stable vitals.  Final Clinical Impression(s) / ED Diagnoses Final diagnoses:  Pain and swelling of eyelid    Rx / DC Orders ED Discharge Orders  Ordered    predniSONE (DELTASONE) 20 MG tablet  Daily        03/06/21 1347    famotidine (PEPCID) 20 MG tablet  Daily        03/06/21 1347    diphenhydrAMINE (BENADRYL) 25 MG tablet  Every 6 hours PRN        03/06/21 1347    erythromycin ophthalmic ointment        03/06/21 1348             Jesusita Oka 03/06/21 1417    Bethann Berkshire, MD 03/10/21 1551

## 2023-09-13 ENCOUNTER — Emergency Department (HOSPITAL_COMMUNITY)
Admission: EM | Admit: 2023-09-13 | Discharge: 2023-09-14 | Disposition: A | Discharge status: Home / Self Care | Payer: Self-pay | Attending: Emergency Medicine | Admitting: Emergency Medicine

## 2023-09-13 ENCOUNTER — Encounter (HOSPITAL_COMMUNITY): Payer: Self-pay | Admitting: Emergency Medicine

## 2023-09-13 ENCOUNTER — Other Ambulatory Visit: Payer: Self-pay

## 2023-09-13 DIAGNOSIS — M79671 Pain in right foot: Secondary | ICD-10-CM | POA: Insufficient documentation

## 2023-09-13 DIAGNOSIS — M7989 Other specified soft tissue disorders: Secondary | ICD-10-CM | POA: Insufficient documentation

## 2023-09-13 DIAGNOSIS — R600 Localized edema: Secondary | ICD-10-CM | POA: Insufficient documentation

## 2023-09-13 DIAGNOSIS — M79672 Pain in left foot: Secondary | ICD-10-CM | POA: Insufficient documentation

## 2023-09-13 LAB — CBC
HCT: 39.8 % (ref 39.0–52.0)
Hemoglobin: 13.3 g/dL (ref 13.0–17.0)
MCH: 29.2 pg (ref 26.0–34.0)
MCHC: 33.4 g/dL (ref 30.0–36.0)
MCV: 87.5 fL (ref 80.0–100.0)
Platelets: 316 10*3/uL (ref 150–400)
RBC: 4.55 MIL/uL (ref 4.22–5.81)
RDW: 12.8 % (ref 11.5–15.5)
WBC: 6 10*3/uL (ref 4.0–10.5)
nRBC: 0 % (ref 0.0–0.2)

## 2023-09-13 LAB — BRAIN NATRIURETIC PEPTIDE: B Natriuretic Peptide: 13.5 pg/mL (ref 0.0–100.0)

## 2023-09-13 LAB — BASIC METABOLIC PANEL WITH GFR
Anion gap: 13 (ref 5–15)
BUN: 10 mg/dL (ref 6–20)
CO2: 20 mmol/L — ABNORMAL LOW (ref 22–32)
Calcium: 8.6 mg/dL — ABNORMAL LOW (ref 8.9–10.3)
Chloride: 102 mmol/L (ref 98–111)
Creatinine, Ser: 1.01 mg/dL (ref 0.61–1.24)
GFR, Estimated: >60 mL/min
Glucose, Bld: 103 mg/dL — ABNORMAL HIGH (ref 70–99)
Potassium: 3.9 mmol/L (ref 3.5–5.1)
Sodium: 135 mmol/L (ref 135–145)

## 2023-09-13 MED ORDER — ACETAMINOPHEN 325 MG PO TABS
650.0000 mg | ORAL_TABLET | Freq: Once | ORAL | Status: AC
  Administered 2023-09-13: 650 mg via ORAL
  Filled 2023-09-13: qty 2

## 2023-09-13 NOTE — ED Provider Triage Note (Signed)
 Emergency Medicine Provider Triage Evaluation Note  Robert Serrano , a 48 y.o. male  was evaluated in triage.  Pt complains of bilateral lower extremity swelling.  Reports onset was February 14.  States that he has never dealt with this issue in the past.  Reports that bilateral lower extremities are swelling.  Denies chest pain, shortness of breath, lightheadedness or dizziness.  Denies a history of CHF.  Denies pain in his calves.  Endorsing pain to his bilateral feet.  Review of Systems  Positive:  Negative:   Physical Exam  BP 110/66 (BP Location: Right Arm)   Pulse 83   Temp 98.4 F (36.9 C)   Resp 18   SpO2 100%  Gen:   Awake, no distress   Resp:  Normal effort  MSK:   Moves extremities without difficulty  Other:  2+ nonpitting edema to bilateral lower extremities.  Medical Decision Making  Medically screening exam initiated at 4:28 PM.  Appropriate orders placed.  Robert Serrano was informed that the remainder of the evaluation will be completed by another provider, this initial triage assessment does not replace that evaluation, and the importance of remaining in the ED until their evaluation is complete.     Al Decant, PA-C 09/13/23 1629

## 2023-09-13 NOTE — ED Triage Notes (Signed)
 Pt reports BLE swelling x 1.5 weeks. Denies any cardiac hx or injury. Denies CP or SOB. Limping on arrival.

## 2023-09-14 ENCOUNTER — Emergency Department (HOSPITAL_COMMUNITY): Payer: Self-pay

## 2023-09-14 LAB — HEPATIC FUNCTION PANEL
ALT: 21 U/L (ref 0–44)
AST: 32 U/L (ref 15–41)
Albumin: 3 g/dL — ABNORMAL LOW (ref 3.5–5.0)
Alkaline Phosphatase: 103 U/L (ref 38–126)
Bilirubin, Direct: 0.2 mg/dL (ref 0.0–0.2)
Indirect Bilirubin: 0.7 mg/dL (ref 0.3–0.9)
Total Bilirubin: 0.9 mg/dL (ref 0.0–1.2)
Total Protein: 7.8 g/dL (ref 6.5–8.1)

## 2023-09-14 LAB — RESP PANEL BY RT-PCR (RSV, FLU A&B, COVID)  RVPGX2
Influenza A by PCR: NEGATIVE
Influenza B by PCR: NEGATIVE
Resp Syncytial Virus by PCR: NEGATIVE
SARS Coronavirus 2 by RT PCR: NEGATIVE

## 2023-09-14 NOTE — Discharge Instructions (Signed)
 It's unclear what is causing your legs to swell.  You need to follow-up with a primary care doctor and may need to see a rheumatologist.    Please call the clinic listed and make an appointment.  Try to eat more protein.    Keep your legs up and elevated as often as possible.

## 2023-09-14 NOTE — ED Notes (Signed)
 Patient discharged home. VSS. Patient ambulatory out of treatment area with clean and steady gait. All questions answered at time of discharge. Belongings sent home with patient.

## 2023-09-14 NOTE — ED Provider Notes (Signed)
 [facility] DEPT [facility] Emergency Department Provider Note MRN:  [id number]  Arrival date & time: 09/14/23     Chief Complaint   Leg Swelling   History of Present Illness   Robert Serrano is a 48 y.o. year-old male presents to the ED with chief complaint of  bilateral lower extremity swelling.  Reports onset was February 14.  States that he has never dealt with this issue in the past.  Reports that bilateral lower extremities are swelling.  Denies chest pain, shortness of breath, lightheadedness or dizziness.  Denies a history of CHF.  Denies pain in his calves.  Endorsing pain to his bilateral feet. He states that he has had some family members that have had something similar.  He reports having some bumps underneath his skin on the backs of his arms and legs and having some bruising. Denies any injury or trauma.  History provided by patient.   Review of Systems  Pertinent positive and negative review of systems noted in HPI.    Physical Exam   Vitals:   09/14/23 0113 09/14/23 0315  BP: 101/70 112/64  Pulse: 71 64  Resp: 17 18  Temp: 98.2 F (36.8 C) 98.6 F (37 C)  SpO2: 99% 100%    CONSTITUTIONAL:  non toxic-appearing, NAD NEURO:  Alert and oriented x 3, CN 3-12 grossly intact EYES:  eyes equal and reactive ENT/NECK:  Supple, no stridor  CARDIO:  normal rate, regular rhythm, appears well-perfused  PULM:  No respiratory distress, CTAB GI/GU:  non-distended,  MSK/SPINE:  No gross deformities, 2+ non pitting edema to bilateral feet and ankles, moves all extremities  SKIN:  no rash, atraumatic, there is some small bruising noted and some non tender nodules beneath the skin on the posterior arms and legs.   *Additional and/or pertinent findings included in MDM below  Diagnostic and Interventional Summary    EKG Interpretation Date/Time:    Ventricular Rate:    PR Interval:    QRS Duration:    QT Interval:    QTC Calculation:   R Axis:      Text  Interpretation:         Labs Reviewed  BASIC METABOLIC PANEL - Abnormal; Notable for the following components:      Result Value   CO2 20 (*)    Glucose, Bld 103 (*)    Calcium 8.6 (*)    All other components within normal limits  HEPATIC FUNCTION PANEL - Abnormal; Notable for the following components:   Albumin 3.0 (*)    All other components within normal limits  RESP PANEL BY RT-PCR (RSV, FLU A&B, COVID)  [id number]  CBC  BRAIN NATRIURETIC PEPTIDE    DG Chest 2 View  Final Result      Medications  acetaminophen (TYLENOL) tablet 650 mg (650 mg Oral Given 09/13/23 2226)     Procedures  /  Critical Care Procedures  ED Course and Medical Decision Making  I have reviewed the triage vital signs, the nursing notes, and pertinent available records from the EMR.  Social Determinants Affecting Complexity of Care: Patient has no clinically significant social determinants affecting this chief complaint..   ED Course:    Medical Decision Making Patient here with bilateral feet swelling for the past 10 days.  No signs of infection.  Non pitting edema.  He looks well.  No hx of the same, but he has family that has had similar.    No sign of infection.  No  calf tenderness.  Doubt DVT.  No trauma.  Doubt fracture or dislocation.  CXR and BNP are normal.  Doubt CHF.  No SOB.    Patient seen by and discussed with Dr. Madilyn Hook, who recommends close outpatient follow-up with PCP.  Uncertain etiology of the patient's symptoms.  Will have him follow up with [facility].  Amount and/or Complexity of Data Reviewed Labs: ordered. Radiology: ordered.         Consultants: No consultations were needed in caring for this patient.   Treatment and Plan: Emergency department workup does not suggest an emergent condition requiring admission or immediate intervention beyond  what has been performed at this time. The patient is safe for discharge and has  been instructed to  return immediately for worsening symptoms, change in  symptoms or any other concerns    Final Clinical Impressions(s) / ED Diagnoses     ICD-10-CM   1. Leg swelling  M79.89       ED Discharge Orders     None         Discharge Instructions Discussed with and Provided to Patient:    Discharge Instructions      It's unclear what is causing your legs to swell.  You need to follow-up with a primary care doctor and may need to see a rheumatologist.    Please call the clinic listed and make an appointment.  Try to eat more protein.    Keep your legs up and elevated as often as possible.      Roxy Horseman, PA-C 09/14/23 0441    Tilden Fossa, MD 09/14/23 [phone]

## 2023-11-03 ENCOUNTER — Ambulatory Visit: Payer: Self-pay | Admitting: Nurse Practitioner

## 2024-01-15 ENCOUNTER — Emergency Department (HOSPITAL_BASED_OUTPATIENT_CLINIC_OR_DEPARTMENT_OTHER)
Admission: EM | Admit: 2024-01-15 | Discharge: 2024-01-15 | Disposition: A | Discharge status: Home / Self Care | Payer: Self-pay | Attending: Emergency Medicine | Admitting: Emergency Medicine

## 2024-01-15 ENCOUNTER — Emergency Department (HOSPITAL_BASED_OUTPATIENT_CLINIC_OR_DEPARTMENT_OTHER): Payer: Self-pay | Admitting: Radiology

## 2024-01-15 ENCOUNTER — Other Ambulatory Visit: Payer: Self-pay

## 2024-01-15 ENCOUNTER — Encounter (HOSPITAL_BASED_OUTPATIENT_CLINIC_OR_DEPARTMENT_OTHER): Payer: Self-pay

## 2024-01-15 DIAGNOSIS — M24412 Recurrent dislocation, left shoulder: Secondary | ICD-10-CM | POA: Insufficient documentation

## 2024-01-15 NOTE — ED Notes (Signed)
 Dc by Lou,RN

## 2024-01-15 NOTE — ED Provider Notes (Cosign Needed Addendum)
  EMERGENCY DEPARTMENT AT [facility] Provider Note   CSN: [id number] Arrival date & time: 01/15/24  1350     Patient presents with: Shoulder Pain   Robert Serrano is a 48 y.o. male with history of recurrent left shoulder dislocations, presents with concern for a dislocation of the left shoulder earlier today.  States he was working on his car when the shoulder seem to pop out of place.  He was able to pop it back into place prior to arrival to the ER.  Denies any numbness or tingling in the left upper extremity.  Denies any pain currently in the left shoulder.  States his left shoulder pops in and out all the time at home.     Shoulder Pain      Prior to Admission medications   Medication Sig Start Date End Date Taking? Authorizing Provider  acetaminophen  (TYLENOL ) 500 MG tablet Take 1,000 mg by mouth every 6 (six) hours as needed for mild pain, moderate pain or headache.     [provider]  diphenhydrAMINE  (BENADRYL ) 25 MG tablet Take 1 tablet (25 mg total) by mouth every 6 (six) hours as needed. 03/06/21   Aberman, Caroline C, PA-C  erythromycin  ophthalmic ointment Place a 1/2 inch ribbon of ointment to upper eyelids bilaterally 03/06/21   Aberman, Aleck BROCKS, PA-C  famotidine  (PEPCID ) 20 MG tablet Take 1 tablet (20 mg total) by mouth daily for 5 days. 03/06/21 03/11/21  Aberman, Caroline C, PA-C  methocarbamol  (ROBAXIN ) 500 MG tablet Take 1 tablet (500 mg total) by mouth 2 (two) times daily. 02/12/16   Kirichenko, Tatyana, PA-C  naproxen  (NAPROSYN ) 500 MG tablet Take 1 tablet (500 mg total) by mouth 2 (two) times daily. 02/12/16   Kirichenko, Tatyana, PA-C    Allergies: Patient has no known allergies.    Review of Systems  Musculoskeletal:        Left shoulder pain    Updated Vital Signs BP 102/76   Pulse (!) 56   Temp 97.9 F (36.6 C) (Oral)   Resp 16   Ht 5' 6 (1.676 m)   Wt 83.9 kg   SpO2 100%   BMI 29.86 kg/m   Physical Exam Vitals and  nursing note reviewed.  Constitutional:      Appearance: Normal appearance.  HENT:     Head: Atraumatic.   Cardiovascular:     Comments: 2+ radial pulse bilaterally Pulmonary:     Effort: Pulmonary effort is normal.   Musculoskeletal:     Comments: Left upper extremity:  General No obvious deformity. No erythema, edema, contusions, open wounds   Palpation Non-tender to palpation along the clavicle, AC joint, glenohumeral joint, humeral head, or distal humerus. Non-tender to palpation over the scapular ridge   ROM Full abduction, adduction, flexion, extension, internal, external rotation. Hesitant to perform these movements  Sensation: Sensation intact throughout the upper extremity  Strength: 5/5 strength with supraspinatus, infraspinatus, subscapularis testing.     Neurological:     General: No focal deficit present.     Mental Status: He is alert.   Psychiatric:        Mood and Affect: Mood normal.        Behavior: Behavior normal.     (all labs ordered are listed, but only abnormal results are displayed) Labs Reviewed - No data to display  EKG: None  Radiology: DG Shoulder Left Result Date: 01/15/2024 CLINICAL DATA:  shoulder injury EXAM: LEFT SHOULDER - 2+ VIEW COMPARISON:  May 14, 2015 FINDINGS: No acute fracture or dislocation. There is no evidence of arthropathy or other focal bone abnormality. Soft tissues are unremarkable. IMPRESSION: No acute fracture or dislocation. Electronically Signed   By: Rogelia Myers M.D.   On: 01/15/2024 15:26     Procedures   Medications Ordered in the ED - No data to display                                  Medical Decision Making Amount and/or Complexity of Data Reviewed Radiology: ordered.     Differential diagnosis includes but is not limited to shoulder dislocation, Hill-Sachs lesion, shoulder strain, nerve injury  ED Course:  Upon initial evaluation, patient is well-appearing, stable vitals.  Not  reporting any pain currently to the left shoulder.  Shoulder appears in place without any obvious deformity.  He does have full range of motion of the bilateral shoulders.  Neurovascularly intact in the bilateral upper extremities.  No concern for vascular or nerve injury at this time.  No bony tenderness to palpation in the left upper extremity.   Imaging Studies ordered: I ordered imaging studies including x-ray left shoulder I independently visualized the imaging with scope of interpretation limited to determining acute life threatening conditions related to emergency care. Imaging showed  No acute abnormalities I agree with the radiologist interpretation  Medications Given: None  Upon re-evaluation, patient remains well-appearing.  Discussed that his x-ray did not show any current dislocation, no fractures.  It sounds like he has multiple shoulder dislocations at home which he is able to reduce usually by himself.  Given the frequency of these, recommended we place him into a shoulder sling and have him follow-up with orthopedics for further management.  He is in agreement with plan.  Shoulder sling provided.  Stable and appropriate for discharge home    Impression: Chronic dislocations of left shoulder  Disposition:  The patient was discharged home with instructions to wear shoulder sling provided at all times, may take off to shower.  Follow-up with orthopedics Dr. Selinda Gosling within the next week for further further evaluation and management. Return precautions given.    Record Review: External records from outside source obtained and reviewed including prior ER notes for shoulder dislocation     This chart was dictated using voice recognition software, Dragon. Despite the best efforts of this provider to proofread and correct errors, errors may still occur which can change documentation meaning.       Final diagnoses:  Chronic dislocation of left shoulder    ED  Discharge Orders     None          Veta Palma, PA-C 01/15/24 1841    Veta Palma, PA-C 01/15/24 1842    Tegeler, Lonni PARAS, MD 01/16/24 [phone]

## 2024-01-15 NOTE — ED Triage Notes (Addendum)
 Pt complaining the left shoulder hurting. He said that it pops in and out of socket off and on since 2019. He was working under his car yesterday and it popped out of socket then back in but it hurts every time he moves it now. Is able to move arm but it hurts.

## 2024-01-15 NOTE — Discharge Instructions (Addendum)
 The x-ray of your left shoulder did not show any signs of a current dislocation.  No signs of fracture.  It sounds like you are having recurrent dislocations of the left shoulder.  This means that the musculature surrounding the shoulder socket is weak.  You need to follow-up with orthopedics Dr. Sharl listed below as soon as possible for further management.  You may need surgical intervention or physical therapy to help prevent this from recurring.  The meantime, you have been provided a shoulder sling to wear to provide stability to the shoulder joint.  Please wear this at all times. You may take this off to shower.   Please return to the emergency department if you have a dislocation you cannot reduce, numbness or tingling in the left arm, any other new or concerning symptoms
